# Patient Record
Sex: Female | Born: 1945 | Race: White | Hispanic: No | Marital: Married
Health system: Southern US, Community
[De-identification: ages and names within clinical notes are randomized; demographics above are authoritative.]

---

## 2011-04-05 ENCOUNTER — Emergency Department: Payer: Self-pay | Admitting: Emergency Medicine

## 2014-02-19 ENCOUNTER — Inpatient Hospital Stay: Payer: Self-pay | Admitting: Psychiatry

## 2014-02-19 LAB — URINALYSIS, COMPLETE
BLOOD: NEGATIVE
Bilirubin,UR: NEGATIVE
Glucose,UR: NEGATIVE mg/dL (ref 0–75)
LEUKOCYTE ESTERASE: NEGATIVE
Nitrite: NEGATIVE
PH: 5 (ref 4.5–8.0)
PROTEIN: NEGATIVE
Specific Gravity: 1.024 (ref 1.003–1.030)
Squamous Epithelial: 1
WBC UR: 4 /HPF (ref 0–5)

## 2014-02-19 LAB — COMPREHENSIVE METABOLIC PANEL
Albumin: 3.6 g/dL (ref 3.4–5.0)
Alkaline Phosphatase: 70 U/L
Anion Gap: 8 (ref 7–16)
BILIRUBIN TOTAL: 0.5 mg/dL (ref 0.2–1.0)
BUN: 17 mg/dL (ref 7–18)
CALCIUM: 8.6 mg/dL (ref 8.5–10.1)
CHLORIDE: 105 mmol/L (ref 98–107)
Co2: 29 mmol/L (ref 21–32)
Creatinine: 0.84 mg/dL (ref 0.60–1.30)
EGFR (African American): >60
EGFR (Non-African Amer.): >60
GLUCOSE: 128 mg/dL — AB (ref 65–99)
OSMOLALITY: 286 (ref 275–301)
POTASSIUM: 2.5 mmol/L — AB (ref 3.5–5.1)
SGOT(AST): 26 U/L (ref 15–37)
SGPT (ALT): 21 U/L (ref 12–78)
Sodium: 142 mmol/L (ref 136–145)
Total Protein: 8 g/dL (ref 6.4–8.2)

## 2014-02-19 LAB — BASIC METABOLIC PANEL
Anion Gap: 5 — ABNORMAL LOW (ref 7–16)
BUN: 17 mg/dL (ref 7–18)
CALCIUM: 8.8 mg/dL (ref 8.5–10.1)
CHLORIDE: 106 mmol/L (ref 98–107)
CREATININE: 0.92 mg/dL (ref 0.60–1.30)
Co2: 32 mmol/L (ref 21–32)
EGFR (African American): >60
EGFR (Non-African Amer.): >60
GLUCOSE: 132 mg/dL — AB (ref 65–99)
Osmolality: 288 (ref 275–301)
POTASSIUM: 2.9 mmol/L — AB (ref 3.5–5.1)
Sodium: 143 mmol/L (ref 136–145)

## 2014-02-19 LAB — CBC
HCT: 43.9 % (ref 35.0–47.0)
HGB: 14.2 g/dL (ref 12.0–16.0)
MCH: 30.3 pg (ref 26.0–34.0)
MCHC: 32.4 g/dL (ref 32.0–36.0)
MCV: 94 fL (ref 80–100)
Platelet: 214 10*3/uL (ref 150–440)
RBC: 4.69 10*6/uL (ref 3.80–5.20)
RDW: 13.8 % (ref 11.5–14.5)
WBC: 8.1 10*3/uL (ref 3.6–11.0)

## 2014-02-19 LAB — DRUG SCREEN, URINE
Amphetamines, Ur Screen: NEGATIVE (ref ?–1000)
Barbiturates, Ur Screen: NEGATIVE (ref ?–200)
Benzodiazepine, Ur Scrn: NEGATIVE (ref ?–200)
CANNABINOID 50 NG, UR ~~LOC~~: NEGATIVE (ref ?–50)
Cocaine Metabolite,Ur ~~LOC~~: NEGATIVE (ref ?–300)
MDMA (ECSTASY) UR SCREEN: NEGATIVE (ref ?–500)
METHADONE, UR SCREEN: NEGATIVE (ref ?–300)
Opiate, Ur Screen: NEGATIVE (ref ?–300)
Phencyclidine (PCP) Ur S: NEGATIVE (ref ?–25)
Tricyclic, Ur Screen: NEGATIVE (ref ?–1000)

## 2014-02-19 LAB — ETHANOL
Ethanol %: <0.003 % (ref 0.000–0.080)
Ethanol: <3 mg/dL

## 2014-02-19 LAB — ACETAMINOPHEN LEVEL

## 2014-02-19 LAB — SALICYLATE LEVEL

## 2014-02-20 LAB — BASIC METABOLIC PANEL
ANION GAP: 6 — AB (ref 7–16)
BUN: 21 mg/dL — ABNORMAL HIGH (ref 7–18)
CALCIUM: 8.5 mg/dL (ref 8.5–10.1)
Chloride: 110 mmol/L — ABNORMAL HIGH (ref 98–107)
Co2: 29 mmol/L (ref 21–32)
Creatinine: 0.64 mg/dL (ref 0.60–1.30)
EGFR (Non-African Amer.): >60
GLUCOSE: 101 mg/dL — AB (ref 65–99)
Osmolality: 292 (ref 275–301)
Potassium: 3.8 mmol/L (ref 3.5–5.1)
Sodium: 145 mmol/L (ref 136–145)

## 2014-02-23 LAB — LIPID PANEL
Cholesterol: 130 mg/dL (ref 0–200)
HDL Cholesterol: 52 mg/dL (ref 40–60)
LDL CHOLESTEROL, CALC: 65 mg/dL (ref 0–100)
Triglycerides: 63 mg/dL (ref 0–200)
VLDL Cholesterol, Calc: 13 mg/dL (ref 5–40)

## 2014-02-23 LAB — FOLATE: Folic Acid: 9.5 ng/mL (ref 3.1–100.0)

## 2014-04-07 ENCOUNTER — Emergency Department: Payer: Self-pay | Admitting: Emergency Medicine

## 2014-04-07 LAB — CBC WITH DIFFERENTIAL/PLATELET
BASOS ABS: 0 10*3/uL (ref 0.0–0.1)
BASOS PCT: 0.6 %
Eosinophil #: 0.1 10*3/uL (ref 0.0–0.7)
Eosinophil %: 0.7 %
HCT: 45.1 % (ref 35.0–47.0)
HGB: 15 g/dL (ref 12.0–16.0)
Lymphocyte #: 1.7 10*3/uL (ref 1.0–3.6)
Lymphocyte %: 21.7 %
MCH: 31.2 pg (ref 26.0–34.0)
MCHC: 33.1 g/dL (ref 32.0–36.0)
MCV: 94 fL (ref 80–100)
MONO ABS: 0.9 x10 3/mm (ref 0.2–0.9)
Monocyte %: 11.1 %
NEUTROS ABS: 5 10*3/uL (ref 1.4–6.5)
NEUTROS PCT: 65.9 %
Platelet: 221 10*3/uL (ref 150–440)
RBC: 4.79 10*6/uL (ref 3.80–5.20)
RDW: 13.9 % (ref 11.5–14.5)
WBC: 7.6 10*3/uL (ref 3.6–11.0)

## 2014-04-07 LAB — BASIC METABOLIC PANEL
ANION GAP: 9 (ref 7–16)
BUN: 21 mg/dL — ABNORMAL HIGH (ref 7–18)
Calcium, Total: 9.7 mg/dL (ref 8.5–10.1)
Chloride: 92 mmol/L — ABNORMAL LOW (ref 98–107)
Co2: 34 mmol/L — ABNORMAL HIGH (ref 21–32)
Creatinine: 0.9 mg/dL (ref 0.60–1.30)
EGFR (Non-African Amer.): >60
Glucose: 112 mg/dL — ABNORMAL HIGH (ref 65–99)
Osmolality: 274 (ref 275–301)
Potassium: 3.2 mmol/L — ABNORMAL LOW (ref 3.5–5.1)
SODIUM: 135 mmol/L — AB (ref 136–145)

## 2014-04-10 LAB — BETA STREP CULTURE(ARMC)

## 2014-06-23 ENCOUNTER — Observation Stay: Payer: Self-pay | Admitting: Internal Medicine

## 2014-06-23 LAB — COMPREHENSIVE METABOLIC PANEL
ALBUMIN: 3 g/dL — AB (ref 3.4–5.0)
Alkaline Phosphatase: 54 U/L
Anion Gap: 9 (ref 7–16)
BUN: 20 mg/dL — ABNORMAL HIGH (ref 7–18)
Bilirubin,Total: 0.7 mg/dL (ref 0.2–1.0)
CO2: 34 mmol/L — AB (ref 21–32)
Calcium, Total: 8.2 mg/dL — ABNORMAL LOW (ref 8.5–10.1)
Chloride: 93 mmol/L — ABNORMAL LOW (ref 98–107)
Creatinine: 0.73 mg/dL (ref 0.60–1.30)
EGFR (African American): >60
GLUCOSE: 130 mg/dL — AB (ref 65–99)
Osmolality: 276 (ref 275–301)
POTASSIUM: 2.4 mmol/L — AB (ref 3.5–5.1)
SGOT(AST): 43 U/L — ABNORMAL HIGH (ref 15–37)
SGPT (ALT): 23 U/L
SODIUM: 136 mmol/L (ref 136–145)
Total Protein: 6.8 g/dL (ref 6.4–8.2)

## 2014-06-23 LAB — RENAL FUNCTION PANEL
Albumin: 2.7 g/dL — ABNORMAL LOW (ref 3.4–5.0)
Anion Gap: 10 (ref 7–16)
BUN: 17 mg/dL (ref 7–18)
CO2: 30 mmol/L (ref 21–32)
Calcium, Total: 7.9 mg/dL — ABNORMAL LOW (ref 8.5–10.1)
Chloride: 102 mmol/L (ref 98–107)
Creatinine: 0.86 mg/dL (ref 0.60–1.30)
EGFR (African American): >60
GLUCOSE: 127 mg/dL — AB (ref 65–99)
OSMOLALITY: 286 (ref 275–301)
POTASSIUM: 3 mmol/L — AB (ref 3.5–5.1)
Phosphorus: 1.4 mg/dL — ABNORMAL LOW (ref 2.5–4.9)
SODIUM: 142 mmol/L (ref 136–145)

## 2014-06-23 LAB — CBC
HCT: 39.5 % (ref 35.0–47.0)
HGB: 12.7 g/dL (ref 12.0–16.0)
MCH: 30.8 pg (ref 26.0–34.0)
MCHC: 32.2 g/dL (ref 32.0–36.0)
MCV: 96 fL (ref 80–100)
PLATELETS: 211 10*3/uL (ref 150–440)
RBC: 4.12 10*6/uL (ref 3.80–5.20)
RDW: 13.6 % (ref 11.5–14.5)
WBC: 7.7 10*3/uL (ref 3.6–11.0)

## 2014-06-23 LAB — CK TOTAL AND CKMB (NOT AT ARMC)
CK, TOTAL: 904 U/L — AB
CK-MB: 5.9 ng/mL — ABNORMAL HIGH (ref 0.5–3.6)

## 2014-06-23 LAB — TROPONIN I

## 2014-06-23 LAB — CK: CK, Total: 875 U/L — ABNORMAL HIGH

## 2014-06-23 LAB — MAGNESIUM: Magnesium: 1.6 mg/dL — ABNORMAL LOW

## 2014-06-24 LAB — URINALYSIS, COMPLETE
Bacteria: NONE SEEN
Bilirubin,UR: NEGATIVE
Blood: NEGATIVE
Glucose,UR: NEGATIVE mg/dL (ref 0–75)
KETONE: NEGATIVE
Leukocyte Esterase: NEGATIVE
Nitrite: NEGATIVE
Ph: 6 (ref 4.5–8.0)
Protein: NEGATIVE
Specific Gravity: 1.016 (ref 1.003–1.030)
Squamous Epithelial: 1

## 2014-06-24 LAB — BASIC METABOLIC PANEL
Anion Gap: 9 (ref 7–16)
BUN: 18 mg/dL (ref 7–18)
CALCIUM: 7.6 mg/dL — AB (ref 8.5–10.1)
CO2: 32 mmol/L (ref 21–32)
Chloride: 104 mmol/L (ref 98–107)
Creatinine: 0.78 mg/dL (ref 0.60–1.30)
EGFR (Non-African Amer.): >60
Glucose: 105 mg/dL — ABNORMAL HIGH (ref 65–99)
Osmolality: 291 (ref 275–301)
Potassium: 3 mmol/L — ABNORMAL LOW (ref 3.5–5.1)
Sodium: 145 mmol/L (ref 136–145)

## 2014-06-24 LAB — CBC WITH DIFFERENTIAL/PLATELET
BASOS ABS: 0 10*3/uL (ref 0.0–0.1)
BASOS PCT: 0.5 %
Eosinophil #: 0.1 10*3/uL (ref 0.0–0.7)
Eosinophil %: 2.1 %
HCT: 34 % — ABNORMAL LOW (ref 35.0–47.0)
HGB: 10.9 g/dL — ABNORMAL LOW (ref 12.0–16.0)
Lymphocyte #: 2 10*3/uL (ref 1.0–3.6)
Lymphocyte %: 33.2 %
MCH: 30.8 pg (ref 26.0–34.0)
MCHC: 32.1 g/dL (ref 32.0–36.0)
MCV: 96 fL (ref 80–100)
Monocyte #: 0.7 x10 3/mm (ref 0.2–0.9)
Monocyte %: 11.5 %
NEUTROS ABS: 3.2 10*3/uL (ref 1.4–6.5)
NEUTROS PCT: 52.7 %
PLATELETS: 147 10*3/uL — AB (ref 150–440)
RBC: 3.54 10*6/uL — AB (ref 3.80–5.20)
RDW: 13.5 % (ref 11.5–14.5)
WBC: 6 10*3/uL (ref 3.6–11.0)

## 2014-06-24 LAB — PHOSPHORUS: PHOSPHORUS: 2.4 mg/dL — AB (ref 2.5–4.9)

## 2014-06-24 LAB — CK-MB: CK-MB: 2.4 ng/mL (ref 0.5–3.6)

## 2014-06-24 LAB — CK: CK, Total: 649 U/L — ABNORMAL HIGH

## 2014-06-24 LAB — TSH: Thyroid Stimulating Horm: 1.79 u[IU]/mL

## 2014-06-24 LAB — MAGNESIUM: MAGNESIUM: 1.6 mg/dL — AB

## 2014-07-07 ENCOUNTER — Emergency Department: Payer: Self-pay | Admitting: Emergency Medicine

## 2014-09-11 IMAGING — CT CT NECK WITH CONTRAST
4 of 5 series · 15 of 33 positions shown, 17 images · IV contrast (isovue)
Comparison: None.

CLINICAL DATA: 67-year-old female with neck pain, dysphagia and
shortness of breath.

EXAM:
CT NECK WITH CONTRAST
TECHNIQUE: Multidetector CT imaging of the neck was performed using the
standard protocol following the bolus administration of intravenous
contrast.
CONTRAST:  75 cc intravenous Isovue 300

[Series 2: axial neck · axial · 0.58mm/px · z∈[-294,-198]mm · 3 of 98 slices shown]
[im 25/98  bone]
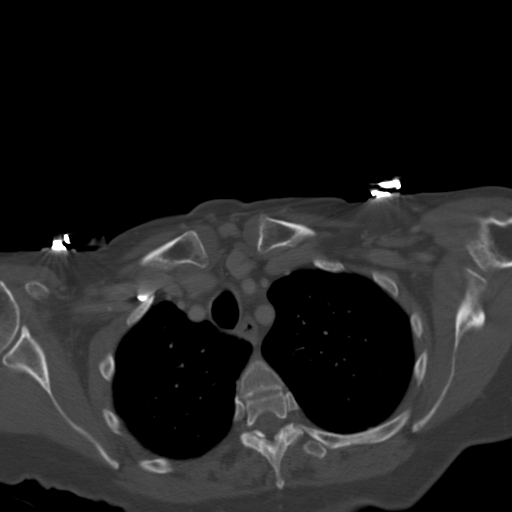
[im 49/98  bone]
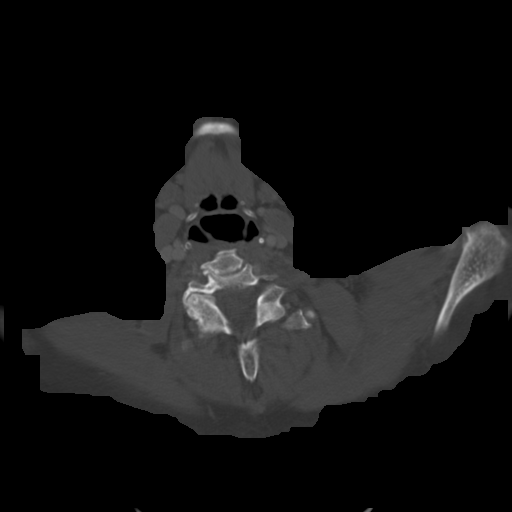
[im 73/98  bone]
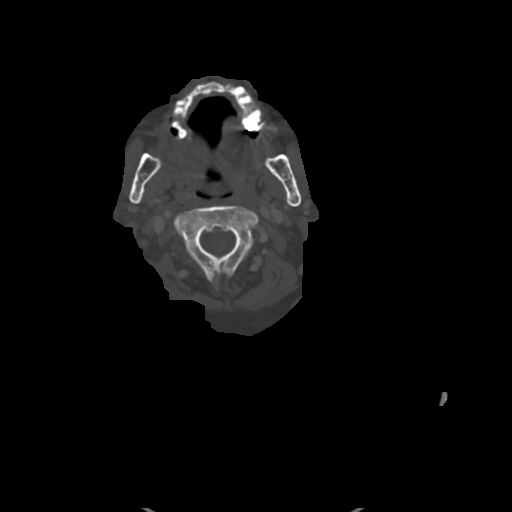

[Series 5: sag neck · sagittal · 0.37mm/px · 5 of 83 slices shown, 6 images]
[im 28/83  bone]
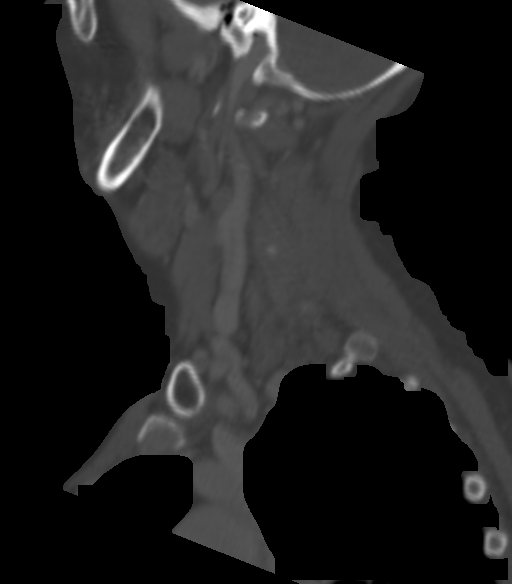
[im 35/83  bone]
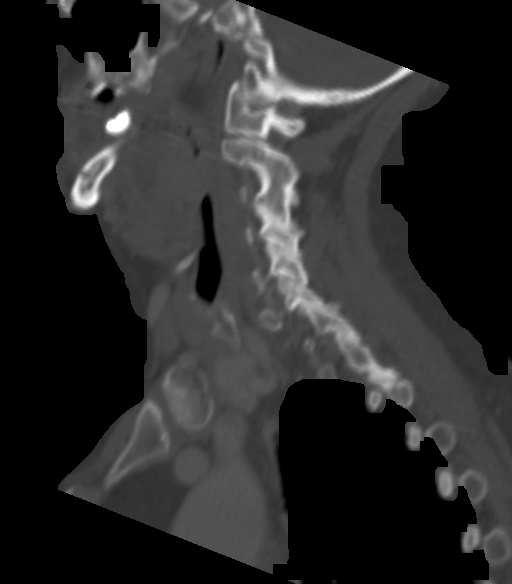
[im 42/83  soft-tissue]
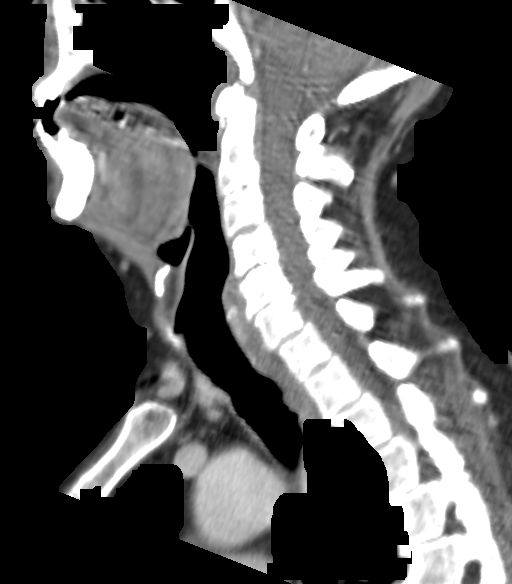
[im 42/83  bone]
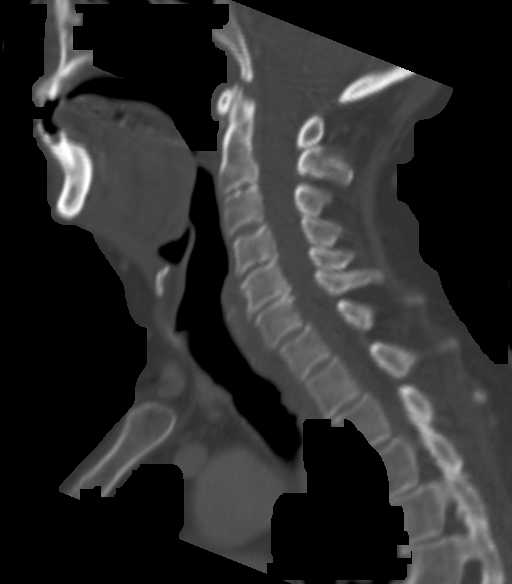
[im 48/83  bone]
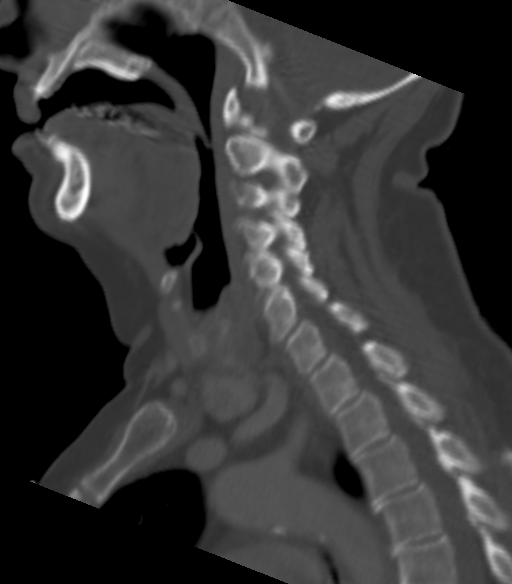
[im 55/83  bone]
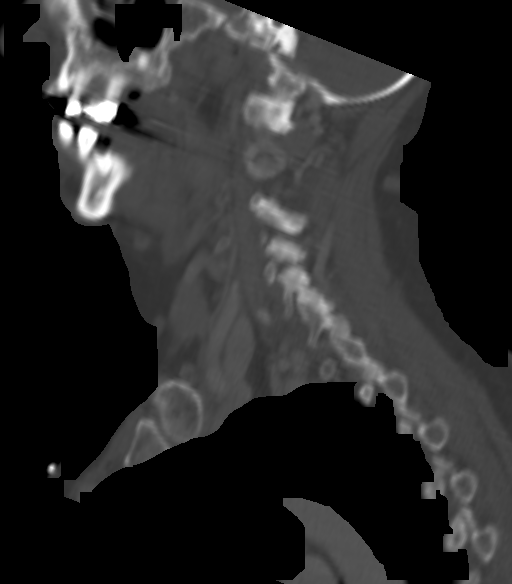

[Series 6: cor neck · coronal · 0.48mm/px · 3 of 101 slices shown]
[im 33/101  bone]
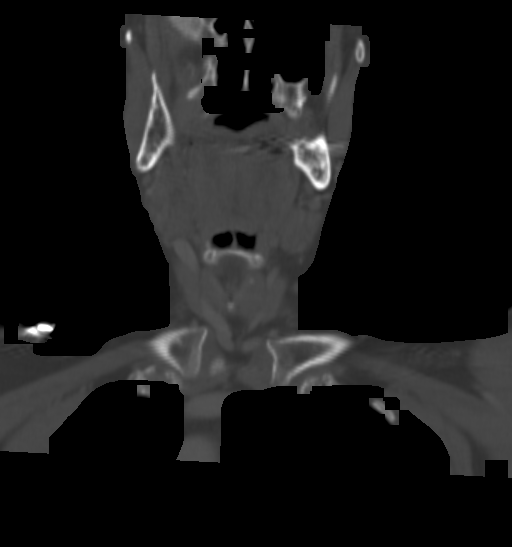
[im 45/101  bone]
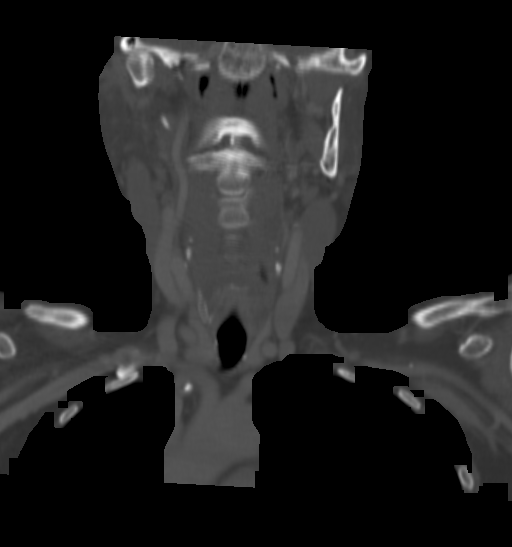
[im 56/101  bone]
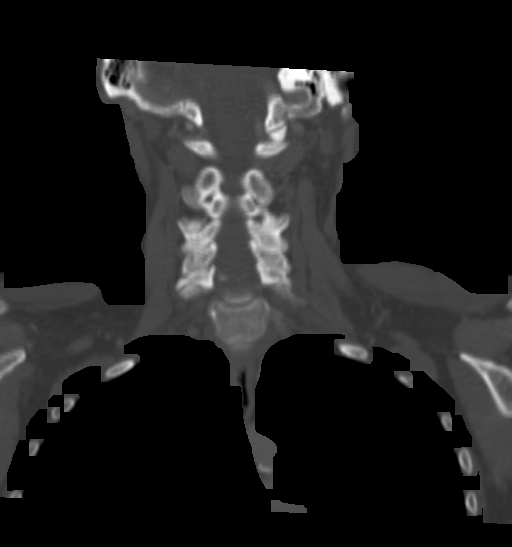

[Series 7: ax oropharynx · axial · 0.38mm/px · z∈[-343,-219]mm · 4 of 116 slices shown, 5 images]
[im 24/116  soft-tissue]
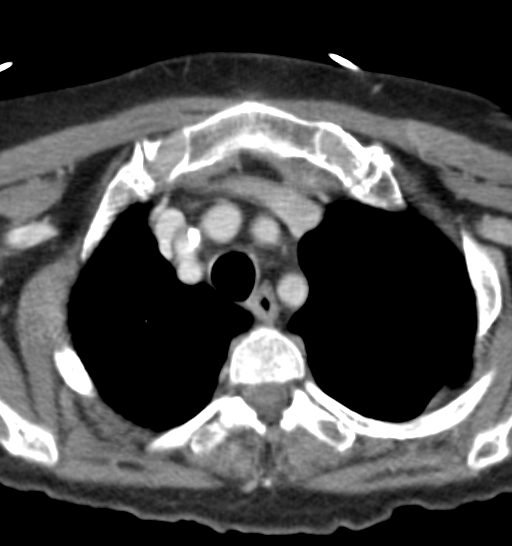
[im 24/116  bone]
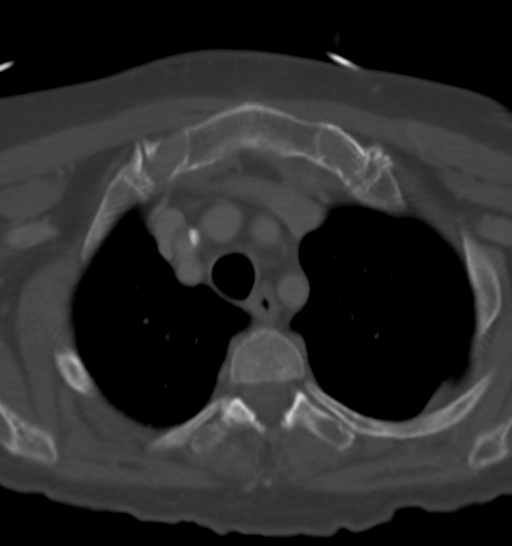
[im 47/116  bone]
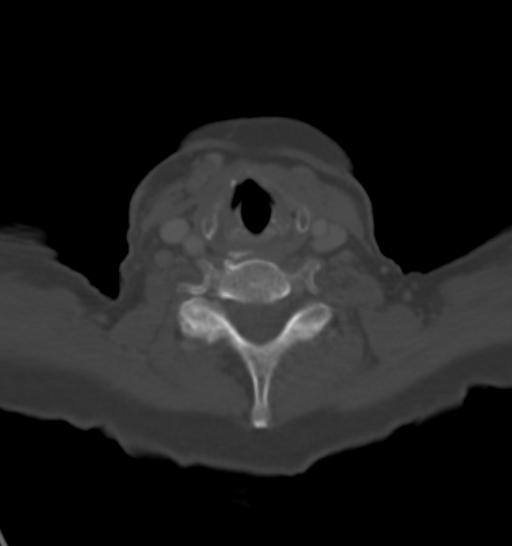
[im 70/116  bone]
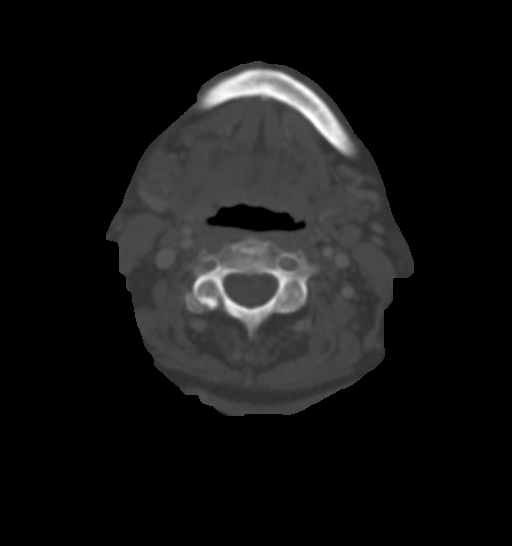
[im 93/116  bone]
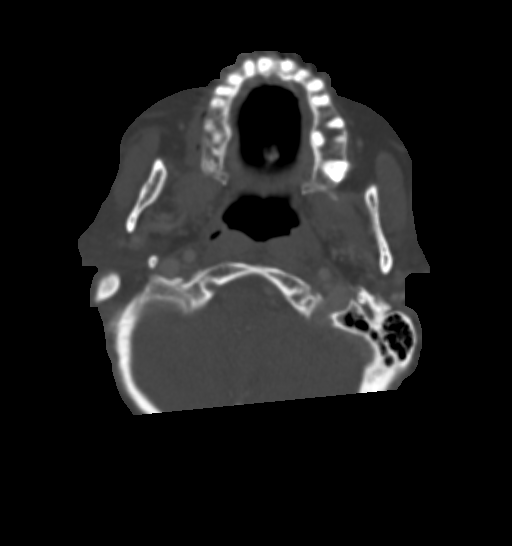

[15 of 33 positions shown; findings below may reference images not displayed]

FINDINGS: The pharynx and larynx are unremarkable.

There is no evidence of mass or abscess.

No enlarged lymph nodes are identified.

The salivary glands and vascular structures are unremarkable.

No acute or suspicious bony abnormalities are identified. Mild
multilevel degenerative disc disease and moderate multilevel facet
arthropathy throughout the cervical spine identified contributing to
bony foraminal at narrowing at several levels.

The lung apices are clear.
IMPRESSION: Unremarkable exam except for degenerative changes within the
cervical spine.

## 2015-01-05 NOTE — H&P (Signed)
PATIENT NAME:  Ellen Adams, Ellen Adams MR#:  [id number] DATE OF BIRTH:  [birth year]  DATE OF ADMISSION:  06/23/2014   REFERRING PHYSICIAN:  Glennie IsleSheryl Gottlieb, MD   FAMILY PHYSICIAN: Nonlocal.   REASON FOR ADMISSION: Altered mental status.   HISTORY OF PRESENT ILLNESS: The patient is a 69 year old female with a history of progressive Alzheimer's disease and B12 deficiency who resides at [facility].  Presents to the Emergency Room with worsening altered mental status and frequent falls.  In the Emergency Room,  the patient was noted to be dehydrated with hypokalemia. She was also noted to have rhabdomyolysis. She is now admitted for further evaluation.   PAST MEDICAL HISTORY:  1.  Alzheimer's dementia.  2.  B12 deficiency.   MEDICATIONS:  1.  Ambien 5 mg p.o. at bedtime p.r.n.  2.  Oxycodone 5 mg p.o. every 8 hours p.r.n.  3.  Hydrochlorothiazide 25 mg p.o. daily.  4.  Haldol 5 mg p.o. at bedtime.  5.  Cymbalta 60 mg p.o. daily.  6.  B12 at 1000 mcg p.o. daily.  7.  Crestor 10 mg p.o. at bedtime.   ALLERGIES:  PENICILLIN, ALCOHOL SWABS, AND ADHESIVE.   SOCIAL HISTORY: Negative for alcohol or tobacco abuse.   FAMILY HISTORY: Unknown.   REVIEW OF SYSTEMS:  Unable to obtain.   PHYSICAL EXAMINATION:  GENERAL: The patient is confused, in no acute distress.  VITAL SIGNS: Currently remarkable for a blood pressure of 144/73, heart rate 102, respiratory rate 21, temperature 98.4, sat 92% on room air.  HEENT: Normocephalic, atraumatic. Pupils equally round and reactive to light and accommodation.  extraocular movements are intact. Sclerae are not icteric. Conjunctivae are clear. Oropharynx is dry but clear.  NECK: Supple without JVD. No adenopathy or thyromegaly is noted.  LUNGS: Clear to auscultation and percussion without wheezes, rales or rhonchi.  No dullness. Respiratory effort is normal.  CARDIAC: Regular rate and rhythm. Normal S1, S2. No significant rubs, murmurs or  gallops. PMI is nondisplaced. Chest wall is nontender.  ABDOMEN: Soft, nontender, positive bowel sounds. No organomegaly or masses were appreciated. No hernias or bruits were noted.  EXTREMITIES: Without clubbing, cyanosis or edema. Pulses were 2+ bilaterally.  SKIN: Warm and dry without rash or lesions.  NEUROLOGIC: Cranial nerves II through XII grossly intact. Deep tendon reflexes were symmetric. Motor and sensory exam is nonfocal.  PSYCHIATRIC: Revealed a patient who is alert, but disoriented to place or time.   LABORATORY DATA:  Head CT was unremarkable with only mild atrophy.  CBC was within normal limits. Her total CK was 904 with an MB of 5.9 and troponin less than 0.02.  Glucose 130, with a BUN of 20, creatinine of 0.73 with a potassium of 2.4.   ASSESSMENT:  1.  Altered mental status with frequent falls.  2.  Rhabdomyolysis.  3.  Dehydration.  4.  Hypokalemia.  5.  Alzheimer dementia.   PLAN: The patient will be observed on off unit telemetry with IV fluids and potassium supplementation. We will begin empiric IV antibiotics and send off a urine and a urine culture. We will follow serial CPKs.  Consult physical therapy and social work.  Hold her Ambien and diuretics at this time. We will decrease her Haldol and actually make it as needed only.  Follow up routine labs in the morning.  Further treatment and evaluation will depend upon the patient's progress.   TOTAL TIME SPENT ON THIS PATIENT: 45 minutes.     ____________________________  Duane Lope. Judithann Sheen, MD jds:DT D: 06/23/2014 10:55:11 ET T: 06/23/2014 11:07:26 ET JOB#: [id number]  cc: Duane Lope. Judithann Sheen, MD, <Dictator> Tashari Schoenfelder Rodena Medin MD ELECTRONICALLY SIGNED 06/23/2014 16:04

## 2015-01-05 NOTE — Consult Note (Signed)
Brief Consult Note: Diagnosis: MDD with psychosis.   Patient was seen by consultant.   Consult note dictated.   Recommend further assessment or treatment.   Orders entered.   Comments: Will admit to psychiatry when bed available. We started Haldol 5 mg at bedtime.  Electronic Signatures: Kristine LineaPucilowska, Kadesia Robel (MD)  (Signed 08-Jun-15 15:41)  Authored: Brief Consult Note   Last Updated: 08-Jun-15 15:41 by Kristine LineaPucilowska, Praise Dolecki (MD)

## 2015-01-05 NOTE — Discharge Summary (Signed)
PATIENT NAME:  Ellen Adams, Ellen Adams MR#:  [id number] DATE OF BIRTH:  11/27/1945  DATE OF ADMISSION:  06/23/2014 DATE OF DISCHARGE:  06/24/2014  ADMISSION DIAGNOSIS:  Acute rhabdomyolysis from a fall.   DISCHARGE DIAGNOSES:   1.  Rhabdomyolysis secondary to fall.  2.  Hypokalemia.  3.  Hypomagnesemia.  4.  Alzheimer dementia.   CONSULTATIONS: None.   LABORATORY DATA:  Sodium 145, potassium 2.0, chloride 104, bicarbonate 32, BUN 18, creatinine 0.78, glucose 105, magnesium 1.6, CK 649, white blood cells 6, hemoglobin 11, hematocrit 34, platelets are 147,000.   HOSPITAL COURSE:  This is a 69 year old female who presented after a fall from assisted living facility, found to have mild rhabdomyolysis and electrolyte abnormalities. For further details, please see H and P.   1.  Rhabdomyolysis secondary to a fall. The patient was placed on IV fluids. CK was monitored. Her CK is trending down.  2.  Hypokalemia and hypomagnesemia.  Repleted prior to discharge.  3.  Alzheimer dementia with mood disturbance. The patient will continue on her outpatient medications.   DISCHARGE MEDICATIONS:  1.  Cymbalta 60 mg daily.  2.  Crestor 10 mg at bedtime.  3.  Haldol 5 mg at bedtime.  4.  Ambien 5 mg at bedtime.  5.  HCTZ 25 mg daily.  6.  Vitamin B 1000 mcg daily.  7.  Oxycodone 5 mg q. 8 hours p.r.n.   DISCHARGE DIET: Regular diet.   DISCHARGE ACTIVITY: As tolerated.   DISCHARGE REFERRAL:  Home health with physical therapy and nurse.  The patient is stable for discharge.   TIME SPENT: 40 minutes.      ____________________________ Janyth ContesSital P. Juliene PinaMody, MD spm:lt D: 06/24/2014 10:20:00 ET T: 06/24/2014 10:53:02 ET JOB#: [id number]  cc: Bradie Sangiovanni P. Juliene PinaMody, MD, <Dictator> Janyth ContesSITAL P Kyaira Trantham MD ELECTRONICALLY SIGNED 06/24/2014 11:25

## 2015-01-05 NOTE — H&P (Signed)
PATIENT NAME:  Ellen Adams, BUIST MR#:  [id number] DATE OF BIRTH:  August 20, 1946  DATE OF ADMISSION:  02/19/2014  REFERRING PHYSICIAN: Lurena Joiner L. Shaune Pollack, MD  ATTENDING PHYSICIAN: Iva Posten B. Jennet Maduro, MD  IDENTIFYING DATA: Ellen Adams is a 69 year old female with history of psychotic depression.   CHIEF COMPLAINT: "I'm scared."   HISTORY OF PRESENT ILLNESS: Ellen Adams has a history of depression that worsened 1 year ago after she lost her husband. She became completely dysfunctional in the past 3 months in spite of taking Cymbalta for depression. She left her house in [place] and moved in without invitation with her niece. She became increasingly paranoid, stopped sleeping and eating, fears that people are out to get her and has not been able to function at all. She eventually decided to come to the hospital, complaining of being frightened and unable to sleep all the time. Initially in the Emergency Room, she was mute. All night long she was sitting in the chair holding her purse. Only after she was given Haldol she relaxed a little bit and was able to answer a few questions. She reports poor sleep, decreased appetite, anhedonia, feeling of guilt, worthlessness, hopelessness, frequent crying spells. She denies thoughts of hurting herself. She reports heightened anxiety. She denies alcohol or illicit substance use.   PAST PSYCHIATRIC HISTORY: She has never been hospitalized. No suicide attempts, has never seen a psychiatrist and her Cymbalta is prescribed by her primary provider.   PAST MEDICAL HISTORY: Chronic pain, dyslipidemia.   ALLERGIES: PENICILLIN, ADHESIVE, ALCOHOL SWAB.   MEDICATIONS ON ADMISSION: Lorazepam 1 mg 3 times daily, lidocaine patch 3 times daily, Pamelor 75 mg 2 capsules daily, Prilosec 40 mg twice daily, oxycodone 5 mg twice daily, Pravachol 20 mg at bedtime, promethazine 25 mg every 6 hours as needed, Lomotil as needed.   SOCIAL HISTORY: As above, she is widowed. She has a trailer  in [place]. She feels that the place is unsafe and she is thinking about moving her trailer to another location. She has Medicare for insurance. She has supportive nieces who are somewhat puzzled by her behavior.   REVIEW OF SYSTEMS:    CONSTITUTIONAL: No fevers or chills. No weight changes.  EYES: No double or blurred vision.  ENT: No hearing loss.  RESPIRATORY: No shortness of breath or cough.  CARDIOVASCULAR: No chest pain or orthopnea.  GASTROINTESTINAL: No abdominal pain, nausea, vomiting or diarrhea.  GENITOURINARY: No incontinence or frequency.  ENDOCRINE: No heat or cold intolerance.  LYMPHATIC: No anemia or easy bruising.  INTEGUMENTARY: No acne or rash.  MUSCULOSKELETAL: No muscle or joint pain.  NEUROLOGIC: No tingling or weakness.  PSYCHIATRIC: See history of present illness for details.   PHYSICAL EXAMINATION: VITAL SIGNS: Blood pressure 156/72, pulse 74, respirations 16, temperature 98.3.  GENERAL: This is a well-developed female in no acute distress.  HEENT: The pupils are equal, round and reactive to light. Sclerae are anicteric.  NECK: Supple. No thyromegaly.  LUNGS: Clear to auscultation. No dullness to percussion.  HEART: Regular rhythm and rate. No murmurs, rubs or gallops.  ABDOMEN: Soft, nontender, nondistended. Positive bowel sounds.  MUSCULOSKELETAL: Normal muscle strength in all extremities.  SKIN: No rashes or bruises.  LYMPHATIC: No cervical adenopathy.  NEUROLOGIC: Cranial nerves II through XII are intact.   LABORATORY, DIAGNOSTIC AND RADIOLOGICAL DATA:  Chemistries are within normal limits except for blood glucose of 128, potassium of 2.5, on recheck 2.9. Blood alcohol level is zero. LFTs within normal limits. A urine tox screen  negative for substances. CBC within normal limits. Urinalysis is not suggestive of urinary tract infection. Serum acetaminophen and salicylates are low.   MENTAL STATUS EXAMINATION ON ADMISSION: The patient is alert and oriented  to person, place, time and situation. She is pleasant, polite and cooperative. She is well groomed and casually dressed. She maintains good eye contact. Her speech is slow. There is poverty of speech. There is psychomotor retardation. Her mood is depressed with anxious affect. Thought process is influenced by her paranoid delusions. She denies thoughts of hurting herself or others. There are no auditory or visual hallucinations. Her cognition is grossly intact. Her short- and long-term memory, registration and recall are intact. She is of average intelligence and fund of knowledge. Her insight and judgment are fair.   SUICIDE RISK ASSESSMENT ON ADMISSION: This is a patient with history of psychotic depression who came to the hospital floridly psychotic, paranoid, possibly catatonic but with no suicidal ideation.   DIAGNOSES: AXIS I: Major depressive disorder, recurrent, severe with psychotic features.  AXIS II: Deferred.  AXIS III: Chronic pain dyslipidemia,  AXIS IV: Mental illness, primary support.  AXIS V: Global assessment of functioning on admission 35.   PLAN: The patient was admitted to [facility] for safety, stabilization and medication management. She was initially placed on suicide precautions and was closely monitored for any unsafe behaviors. She underwent full psychiatric and risk assessment. She received pharmacotherapy, individual and group psychotherapy, substance abuse counseling and support from therapeutic milieu.  1.  Psychosis: We will continue Haldol. Apparently, she responded well to it.  I am not certain which antidepressant she is taking. Pamelor and Cymbalta are named in her chart.  We will clarify.   2.  Anxiety. She was on Ativan 1 mg 3 times daily, pretty hefty dose for an older lady. We will hold that.  3.  Chronic pain form injured ankle that needs to be fixed. We will continue oxycodone as this is prescribed by her primary  provider.  4.  Medical: We will continue all other medications.  5.  Disposition: Hopefully, she will return to her home in [place].    ____________________________ Ellin GoodieJolanta B. Jennet MaduroPucilowska, MD jbp:cs D: 02/19/2014 17:53:31 ET T: 02/19/2014 19:22:38 ET JOB#: [id number]  cc: Raynie Steinhaus B. Jennet MaduroPucilowska, MD, <Dictator> Shari ProwsJOLANTA B Jamirra Curnow MD ELECTRONICALLY SIGNED 02/21/2014 6:59
